# Patient Record
Sex: Female | Born: 2000 | Race: White | Hispanic: No | Marital: Single | Smoking: Never smoker
Health system: Southern US, Community
[De-identification: ages and names within clinical notes are randomized; demographics above are authoritative.]

## PROBLEM LIST (undated history)

## (undated) DIAGNOSIS — L309 Dermatitis, unspecified: Secondary | ICD-10-CM

## (undated) HISTORY — DX: Dermatitis, unspecified: L30.9

---
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
## 2020-02-11 DIAGNOSIS — K146 Glossodynia: Secondary | ICD-10-CM | POA: Diagnosis not present

## 2020-02-11 DIAGNOSIS — R220 Localized swelling, mass and lump, head: Secondary | ICD-10-CM | POA: Diagnosis not present

## 2021-01-01 DIAGNOSIS — S65511A Laceration of blood vessel of left index finger, initial encounter: Secondary | ICD-10-CM | POA: Diagnosis not present

## 2021-08-14 DIAGNOSIS — L309 Dermatitis, unspecified: Secondary | ICD-10-CM | POA: Diagnosis not present

## 2021-08-14 DIAGNOSIS — T300 Burn of unspecified body region, unspecified degree: Secondary | ICD-10-CM | POA: Diagnosis not present

## 2021-08-28 DIAGNOSIS — S60522D Blister (nonthermal) of left hand, subsequent encounter: Secondary | ICD-10-CM | POA: Diagnosis not present

## 2021-08-28 DIAGNOSIS — S60521D Blister (nonthermal) of right hand, subsequent encounter: Secondary | ICD-10-CM | POA: Diagnosis not present

## 2021-09-18 DIAGNOSIS — T300 Burn of unspecified body region, unspecified degree: Secondary | ICD-10-CM | POA: Diagnosis not present

## 2024-01-03 ENCOUNTER — Encounter (HOSPITAL_BASED_OUTPATIENT_CLINIC_OR_DEPARTMENT_OTHER): Payer: Self-pay | Admitting: Emergency Medicine

## 2024-01-03 ENCOUNTER — Other Ambulatory Visit: Payer: Self-pay

## 2024-01-03 ENCOUNTER — Emergency Department (HOSPITAL_BASED_OUTPATIENT_CLINIC_OR_DEPARTMENT_OTHER): Payer: BC Managed Care – PPO | Admitting: Radiology

## 2024-01-03 ENCOUNTER — Emergency Department (HOSPITAL_BASED_OUTPATIENT_CLINIC_OR_DEPARTMENT_OTHER)
Admission: EM | Admit: 2024-01-03 | Discharge: 2024-01-04 | Disposition: A | Discharge status: Home / Self Care | Payer: BC Managed Care – PPO | Attending: Emergency Medicine | Admitting: Emergency Medicine

## 2024-01-03 DIAGNOSIS — R059 Cough, unspecified: Secondary | ICD-10-CM | POA: Diagnosis present

## 2024-01-03 DIAGNOSIS — J111 Influenza due to unidentified influenza virus with other respiratory manifestations: Secondary | ICD-10-CM

## 2024-01-03 DIAGNOSIS — J40 Bronchitis, not specified as acute or chronic: Secondary | ICD-10-CM | POA: Insufficient documentation

## 2024-01-03 NOTE — ED Triage Notes (Signed)
 Flu + (DX in [place] hole) Cough. Flew home today  coughing, sob

## 2024-01-04 MED ORDER — ONDANSETRON 4 MG PO TBDP
4.0000 mg | ORAL_TABLET | Freq: Once | ORAL | Status: AC
  Administered 2024-01-04: 4 mg via ORAL
  Filled 2024-01-04: qty 1

## 2024-01-04 MED ORDER — ONDANSETRON 4 MG PO TBDP: 4.0000 mg | ORAL_TABLET | Freq: Three times a day (TID) | ORAL | 0 refills | Status: AC | PRN

## 2024-01-04 NOTE — ED Notes (Addendum)
 Pt's father has repeatedly come out in the hallway, increasingly angry at staff because of wait times. EDP has been updated multiple times. Father requested water for the patient, staff informed him there are NPO orders in place at baseline and they are awaiting the EDP to advise.

## 2024-01-04 NOTE — Discharge Instructions (Addendum)
 You may take over-the-counter medicine for symptomatic relief, such as Tylenol, Motrin, TheraFlu, Alka seltzer , black elderberry, etc. Please limit acetaminophen (Tylenol) to 4000 mg and Ibuprofen (Motrin, Advil, etc.) to 2400 mg for a 24hr period. Please note that other over-the-counter medicine may contain acetaminophen or ibuprofen as a component of their ingredients.   For pain control you may take 1000 mg of acetaminophen (Tylenol) every 8 hours and/or 600 mg of Ibuprofen (Motrin, Advil, etc.) every 6-8 hours as needed.

## 2024-01-04 NOTE — ED Provider Notes (Signed)
 [place] EMERGENCY DEPARTMENT AT [facility] Provider Note  CSN: [id number] Arrival date & time: 01/03/24 2101  Chief Complaint(s) Influenza  HPI Samantha Campos is a 23 y.o. female here for 2 to 3 days of flulike symptoms.  Reports that she was diagnosed with influenza B at the urgent care center in [facility]  yesterday.  Instructed to take Tylenol Motrin which she has been alternating.  Flew back from [place]  this evening and on the flight, she reports that her cough was more severe.  She noted streaky hemoptysis and also hematemesis.  She denies any overt chest pain or shortness of breath.  No abdominal pain.  No lower extremity swelling or pain.  No prior history of DVT/PEs.  No OCP use.  Has tolerated PO since landing several hours ago.    Influenza   Past Medical History Past Medical History:  Diagnosis Date   Eczema    Patient Active Problem List   Diagnosis Date Noted   Stomach pain 12/12/2018   Blood in stool 12/12/2018   Diarrhea 12/12/2018   Well adolescent visit 07/22/2018   BMI (body mass index), pediatric, 85% to less than 95% for age 33/01/2018   BMI (body mass index), pediatric, 5% to less than 85% for age 63/13/2017   Impetigo 07/02/2016   Strep throat 12/05/2015   Home Medication(s) Prior to Admission medications   Medication Sig Start Date End Date Taking? Authorizing Provider  ondansetron  (ZOFRAN -ODT) 4 MG disintegrating tablet Take 1 tablet (4 mg total) by mouth every 8 (eight) hours as needed for up to 3 days for nausea or vomiting. 01/04/24 01/07/24 Yes Karmon Andis, Raynell Moder, MD                                                                                                                                    Allergies Patient has no known allergies.  Review of Systems Review of Systems As noted in HPI  Physical Exam Vital Signs  I have reviewed the triage vital signs BP 112/76 (BP Location: Right Arm)   Pulse 60   Temp 97.8 F  (36.6 C) (Oral)   Resp 14   SpO2 99%   Physical Exam Vitals reviewed.  Constitutional:      General: She is not in acute distress.    Appearance: She is well-developed. She is not diaphoretic.  HENT:     Head: Normocephalic and atraumatic.     Nose: Nose normal.  Eyes:     General: No scleral icterus.       Right eye: No discharge.        Left eye: No discharge.     Conjunctiva/sclera: Conjunctivae normal.     Pupils: Pupils are equal, round, and reactive to light.  Cardiovascular:     Rate and Rhythm: Normal rate and regular rhythm.     Heart sounds: No murmur heard.    No friction rub.  No gallop.  Pulmonary:     Effort: Pulmonary effort is normal. No respiratory distress.     Breath sounds: Normal breath sounds. No stridor. No rales.  Abdominal:     General: There is no distension.     Palpations: Abdomen is soft.     Tenderness: There is no abdominal tenderness.  Musculoskeletal:        General: No tenderness.     Cervical back: Normal range of motion and neck supple.  Skin:    General: Skin is warm and dry.     Findings: No erythema or rash.  Neurological:     Mental Status: She is alert and oriented to person, place, and time.     ED Results and Treatments Labs (all labs ordered are listed, but only abnormal results are displayed) Labs Reviewed - No data to display                                                                                                                       EKG  EKG Interpretation Date/Time:    Ventricular Rate:    PR Interval:    QRS Duration:    QT Interval:    QTC Calculation:   R Axis:      Text Interpretation:         Radiology DG Chest 2 View Result Date: 01/03/2024 CLINICAL DATA:  Cough short of breath EXAM: CHEST - 2 VIEW COMPARISON:  None Available. FINDINGS: Mild central airways thickening and streaky right infrahilar opacity. No pleural effusion. Normal cardiac size. No pneumothorax IMPRESSION: Mild central airways  thickening and streaky right infrahilar opacity, question bronchitis or early pneumonia. Electronically Signed   By: Luke Bun M.D.   On: 01/03/2024 22:00    Medications Ordered in ED Medications  ondansetron  (ZOFRAN -ODT) disintegrating tablet 4 mg (4 mg Oral Given 01/04/24 0106)   Procedures Procedures  (including critical care time) Medical Decision Making / ED Course   Medical Decision Making Amount and/or Complexity of Data Reviewed Radiology: ordered.  Risk Prescription drug management.    23 y.o. female presents with flu-like symptoms for 3 days. Adequate oral hydration. Rest of history as above.  Patient appears well. No signs of toxicity, patient is interactive. No hypoxia, tachypnea or other signs of respiratory distress. No sign of clinical dehydration. Lung exam clear. Rest of exam as above.  Most consistent with flu-like illness.  Chest x-ray with evidence of bronchitis.  No evidence suggestive of pharyngitis, AOM, PNA.  Abdomen is benign so I have low suspicion for any serious intra-abdominal inflammatory/infectious process requiring additional imaging at this time.  Her clinical picture is most consistent with a viral process/bronchitis.  My suspicion for pulmonary embolism is low at this time. Discussed symptomatic treatment with the patient and they will follow closely with their PCP.    Final Clinical Impression(s) / ED Diagnoses Final diagnoses:  Influenza-like illness  Bronchitis   The patient appears reasonably screened and/or stabilized for discharge  and I doubt any other medical condition or other [facility] requiring further screening, evaluation, or treatment in the ED at this time. I have discussed the findings, Dx and Tx plan with the patient/family who expressed understanding and agree(s) with the plan. Discharge instructions discussed at length. The patient/family was given strict return precautions who verbalized understanding of the instructions. No  further questions at time of discharge.  Disposition: Discharge  Condition: Good  ED Discharge Orders          Ordered    ondansetron  (ZOFRAN -ODT) 4 MG disintegrating tablet  Every 8 hours PRN        01/04/24 0103             Follow Up: Primary care provider  Call  to schedule an appointment for close follow up    This chart was dictated using voice recognition software.  Despite best efforts to proofread,  errors can occur which can change the documentation meaning.    Trine Raynell Moder, MD 01/04/24 [phone]
